# Patient Record
Sex: Male | Born: 1985 | Race: White | Hispanic: No | Marital: Married | Smoking: Former smoker
Health system: Southern US, Community
[De-identification: ages and names within clinical notes are randomized; demographics above are authoritative.]

## PROBLEM LIST (undated history)

## (undated) HISTORY — PX: HERNIA REPAIR: SHX51

---
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
## 2008-10-26 ENCOUNTER — Emergency Department: Payer: Self-pay | Admitting: Emergency Medicine

## 2009-09-19 ENCOUNTER — Emergency Department: Payer: Self-pay | Admitting: Emergency Medicine

## 2011-04-27 ENCOUNTER — Emergency Department (HOSPITAL_COMMUNITY)
Admission: EM | Admit: 2011-04-27 | Discharge: 2011-04-27 | Disposition: A | Discharge status: Home / Self Care | Payer: Self-pay | Attending: Emergency Medicine | Admitting: Emergency Medicine

## 2011-04-27 ENCOUNTER — Encounter: Payer: Self-pay | Admitting: Emergency Medicine

## 2011-04-27 DIAGNOSIS — R231 Pallor: Secondary | ICD-10-CM | POA: Insufficient documentation

## 2011-04-27 DIAGNOSIS — R05 Cough: Secondary | ICD-10-CM | POA: Insufficient documentation

## 2011-04-27 DIAGNOSIS — Z79899 Other long term (current) drug therapy: Secondary | ICD-10-CM | POA: Insufficient documentation

## 2011-04-27 DIAGNOSIS — R059 Cough, unspecified: Secondary | ICD-10-CM | POA: Insufficient documentation

## 2011-04-27 DIAGNOSIS — R110 Nausea: Secondary | ICD-10-CM | POA: Insufficient documentation

## 2011-04-27 DIAGNOSIS — R000 Tachycardia, unspecified: Secondary | ICD-10-CM | POA: Insufficient documentation

## 2011-04-27 DIAGNOSIS — IMO0001 Reserved for inherently not codable concepts without codable children: Secondary | ICD-10-CM | POA: Insufficient documentation

## 2011-04-27 DIAGNOSIS — J111 Influenza due to unidentified influenza virus with other respiratory manifestations: Secondary | ICD-10-CM | POA: Insufficient documentation

## 2011-04-27 DIAGNOSIS — R509 Fever, unspecified: Secondary | ICD-10-CM | POA: Insufficient documentation

## 2011-04-27 DIAGNOSIS — R070 Pain in throat: Secondary | ICD-10-CM | POA: Insufficient documentation

## 2011-04-27 MED ORDER — ONDANSETRON HCL 4 MG/2ML IJ SOLN
4.0000 mg | Freq: Once | INTRAMUSCULAR | Status: AC
  Administered 2011-04-27: 4 mg via INTRAVENOUS
  Filled 2011-04-27: qty 2

## 2011-04-27 MED ORDER — SODIUM CHLORIDE 0.9 % IV BOLUS (SEPSIS)
1000.0000 mL | Freq: Once | INTRAVENOUS | Status: AC
  Administered 2011-04-27: 1000 mL via INTRAVENOUS

## 2011-04-27 MED ORDER — OSELTAMIVIR PHOSPHATE 75 MG PO CAPS: 75.0000 mg | ORAL_CAPSULE | Freq: Two times a day (BID) | ORAL | Status: AC

## 2011-04-27 MED ORDER — KETOROLAC TROMETHAMINE 30 MG/ML IJ SOLN
30.0000 mg | Freq: Once | INTRAMUSCULAR | Status: AC
  Administered 2011-04-27: 30 mg via INTRAVENOUS
  Filled 2011-04-27: qty 1

## 2011-04-27 NOTE — ED Provider Notes (Signed)
History     CSN: [id number]  Arrival date & time 04/27/11  1422   First MD Initiated Contact with Patient 04/27/11 1528      No chief complaint on file.   (Consider location/radiation/quality/duration/timing/severity/associated sxs/prior treatment) HPI Comments: wifr with flu treated with Tamiflu not patient with 24 hours fever, myalgias, HA nausea   The history is provided by the patient.    No past medical history on file.  No past surgical history on file.  No family history on file.  History  Substance Use Topics  . Smoking status: Former Games developer  . Smokeless tobacco: Not on file  . Alcohol Use: No      Review of Systems  Constitutional: Positive for fever and chills.  HENT: Positive for sore throat. Negative for congestion, rhinorrhea and neck stiffness.   Eyes: Negative.   Respiratory: Positive for cough. Negative for shortness of breath and wheezing.   Cardiovascular: Negative.   Gastrointestinal: Positive for nausea.  Genitourinary: Negative for frequency.  Musculoskeletal: Positive for myalgias.  Neurological: Negative for dizziness.  Hematological: Negative.   Psychiatric/Behavioral: Negative.     Allergies  Review of patient's allergies indicates no known allergies.  Home Medications   Current Outpatient Rx  Name Route Sig Dispense Refill  . IBUPROFEN 200 MG PO TABS Oral Take 200 mg by mouth every 6 (six) hours as needed. For pain      . OSELTAMIVIR PHOSPHATE 45 MG PO CAPS Oral Take 45 mg by mouth 1 day or 1 dose.      . OSELTAMIVIR PHOSPHATE 75 MG PO CAPS Oral Take 1 capsule (75 mg total) by mouth every 12 (twelve) hours. 10 capsule 0    BP 128/73  Pulse 105  Temp(Src) 98.7 F (37.1 C) (Oral)  Resp 22  SpO2 100%  Physical Exam  Constitutional: He is oriented to person, place, and time. He appears well-developed.  HENT:  Head: Normocephalic.  Eyes: Pupils are equal, round, and reactive to light.  Neck: Normal range of motion.    Cardiovascular: Tachycardia present.   Neurological: He is oriented to person, place, and time.  Skin: Skin is warm and dry. There is pallor.  Psychiatric: He has a normal mood and affect.    ED Course  Procedures (including critical care time)  Labs Reviewed - No data to display No results found.   1. Influenza     Her will discharge to allow him to travel, with daughter to intensive care unit [facility]  MDM  Most likely flu + contacts within home         Arman Filter, NP 04/27/11 1619  Arman Filter, NP 04/27/11 1710  Arman Filter, NP 04/27/11 1711  Arman Filter, NP 04/27/11 1714

## 2011-04-27 NOTE — ED Notes (Signed)
Pt c/o of cough, fever and body aches for 2 days. States "fever went down last night".

## 2011-04-27 NOTE — ED Notes (Signed)
Father of patient in rm 39 requests to go with daughter, advised to be treated and then at discharge go to [facility]. Requests that his treatment be expedited.

## 2011-04-27 NOTE — ED Notes (Signed)
Patient complains of fever chills, nausea, body aches and headache

## 2011-04-28 NOTE — ED Provider Notes (Signed)
Medical screening examination/treatment/procedure(s) were performed by non-physician practitioner and as supervising physician I was immediately available for consultation/collaboration.  Juliet Rude. Rubin Payor, MD 04/28/11 [phone]

## 2012-05-10 IMAGING — CT CT ABD-PELV W/ CM
2 of 3 series · 15 of 42 positions shown, 19 images · non-contrast
Comparison: none

REASON FOR EXAM: (1) RLQ  pain; (2) RLQ  pain
COMMENTS:

[Series 2: soft tissue · axial · 0.75mm/px · z∈[-1030,-583]mm · 12 of 169 slices shown, 16 images]
[im 13/169  soft-tissue]
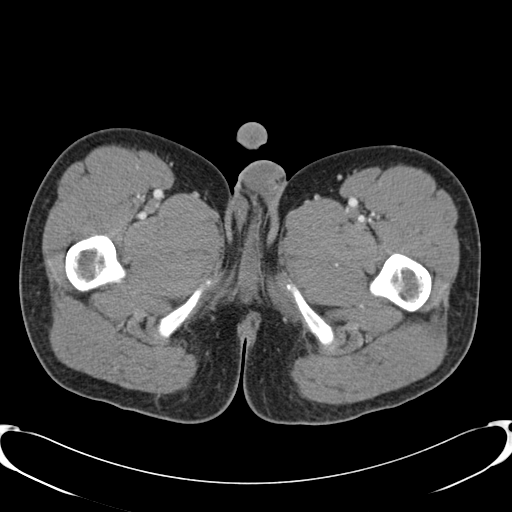
[im 13/169  bone]
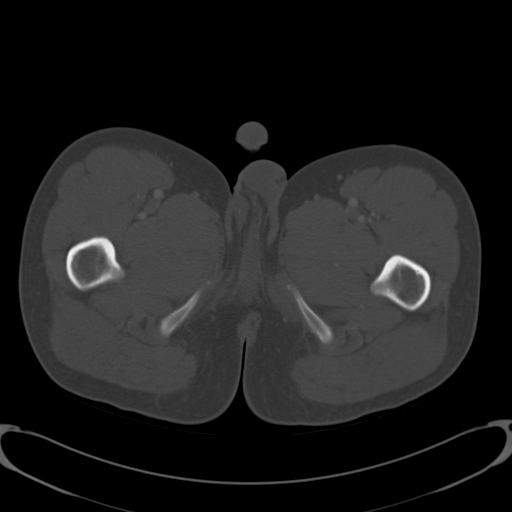
[im 26/169  soft-tissue]
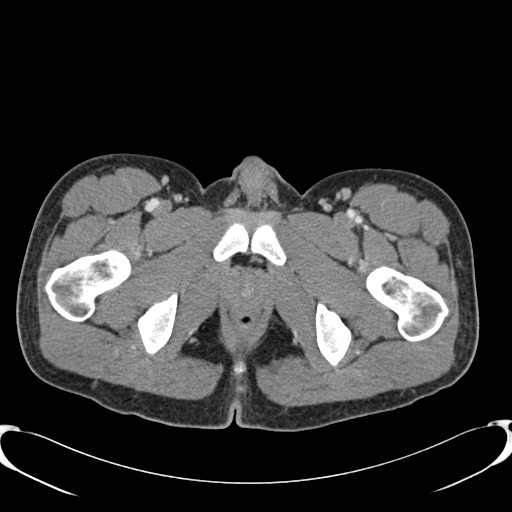
[im 46/169  soft-tissue]
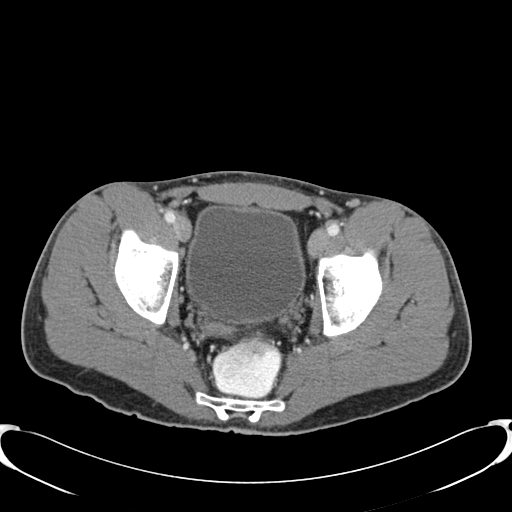
[im 59/169  soft-tissue]
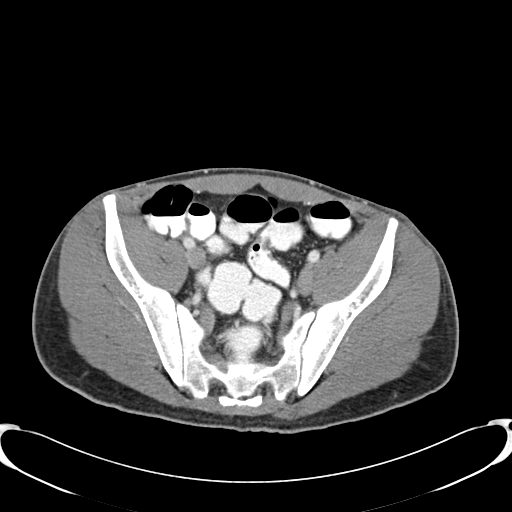
[im 78/169  soft-tissue]
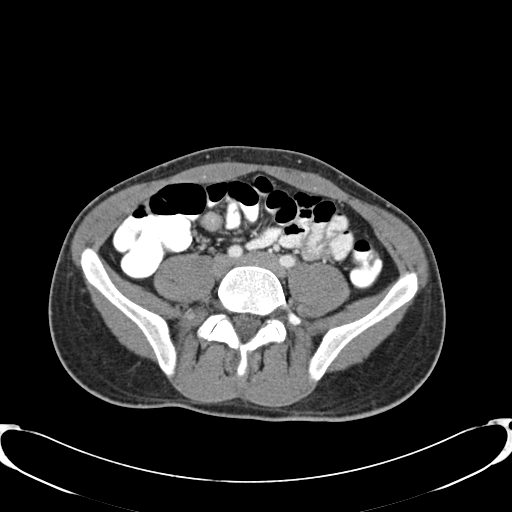
[im 91/169  soft-tissue]
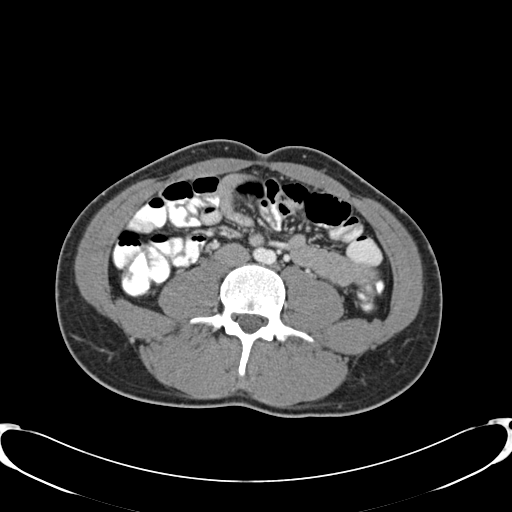
[im 110/169  soft-tissue]
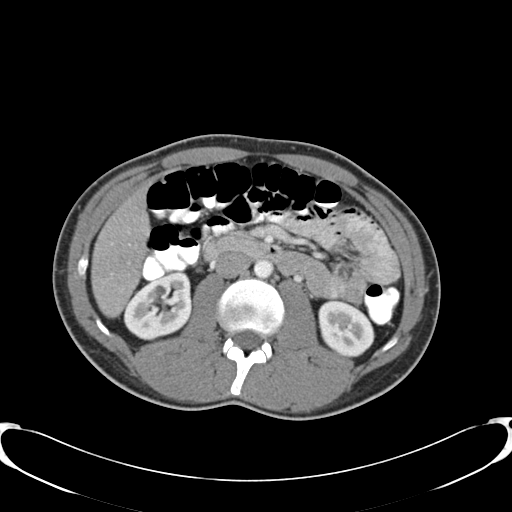
[im 123/169  soft-tissue]
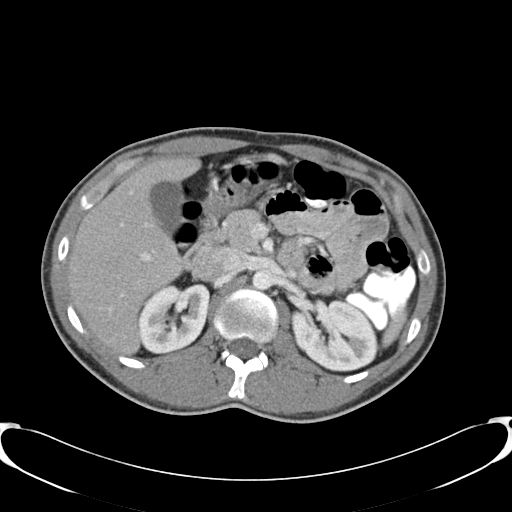
[im 143/169  soft-tissue]
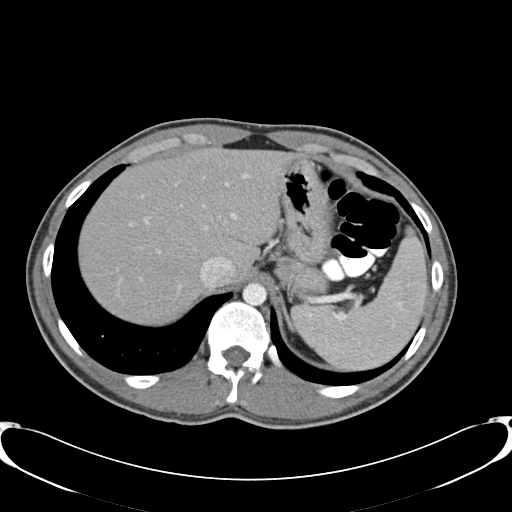
[im 143/169  lung]
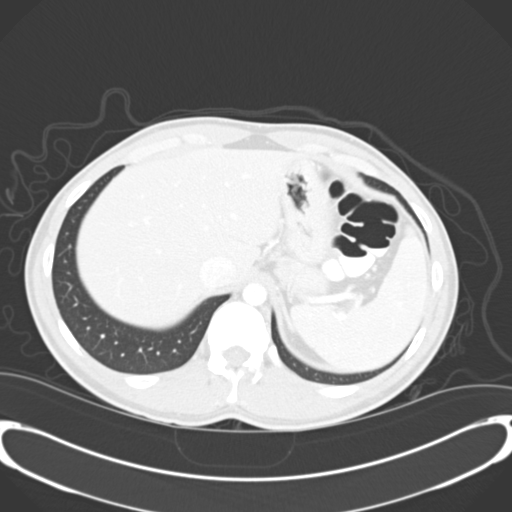
[im 143/169  bone]
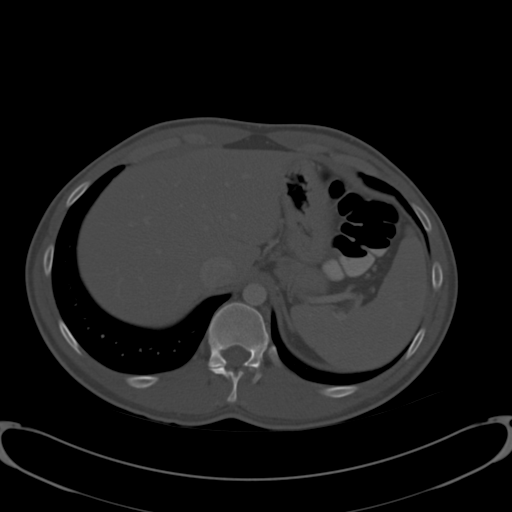
[im 149/169  lung]
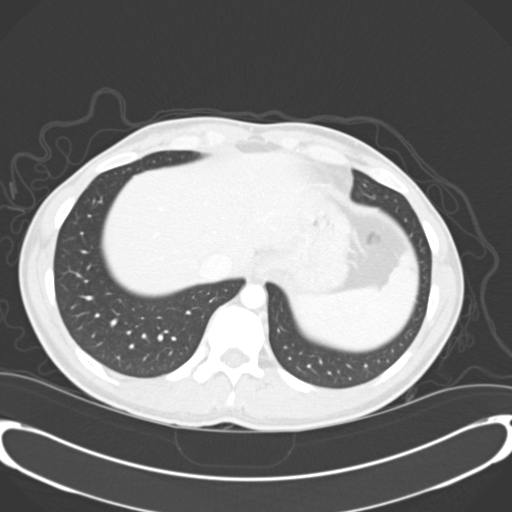
[im 156/169  soft-tissue]
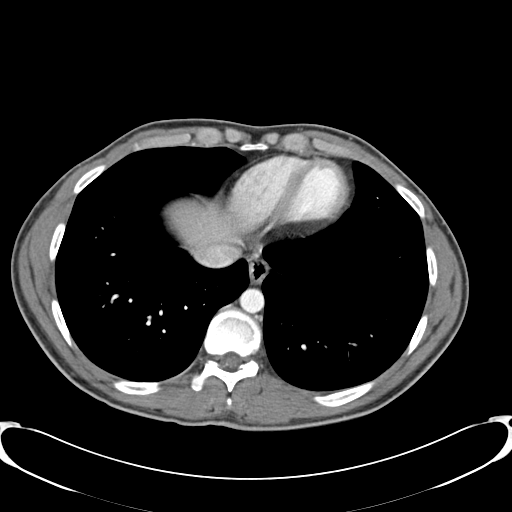
[im 156/169  lung]
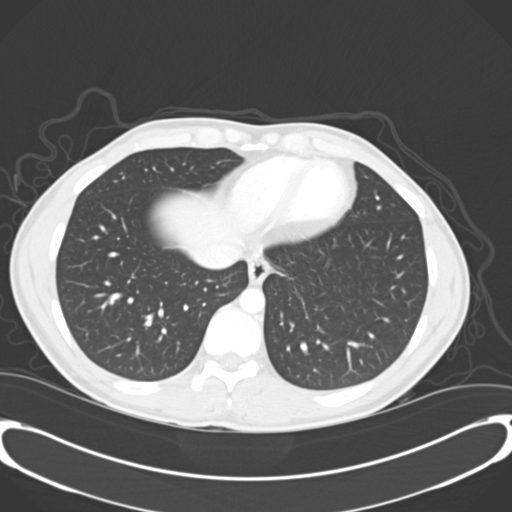
[im 162/169  lung]
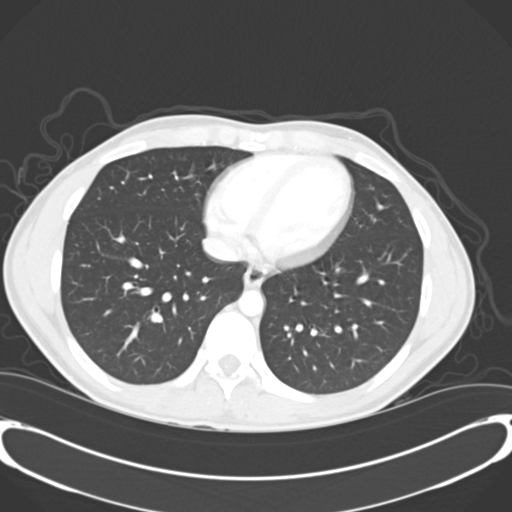

[Series 5: coronal · coronal · 0.99mm/px · 3 of 75 slices shown]
[im 25/75  soft-tissue]
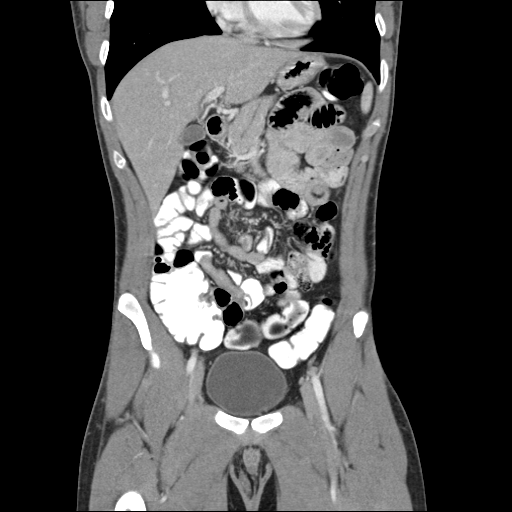
[im 33/75  soft-tissue]
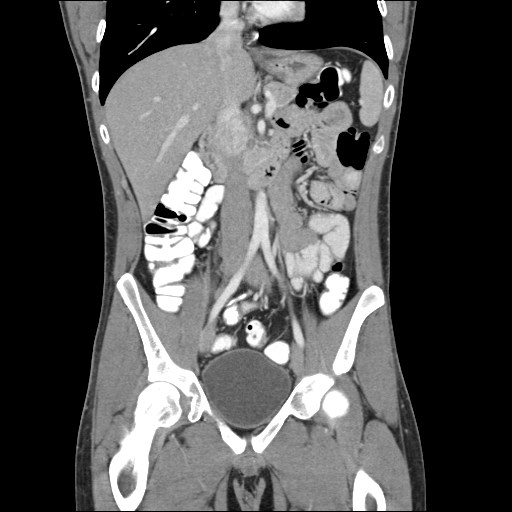
[im 42/75  soft-tissue]
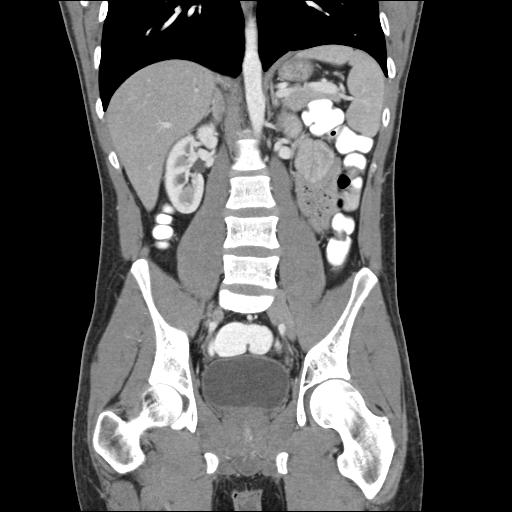

[15 of 42 positions shown; findings below may reference images not displayed]

PROCEDURE:     CT  - CT ABDOMEN / PELVIS  W  - September 19, 2009  [DATE]

RESULT:     Axial CT scanning was performed through the abdomen and pelvis
at 3 mm intervals and slice thicknesses following intravenous administration
of 100 cc of Isovue 370. The patient also received oral contrast material.
Review of multiplanar reconstructed images was performed separately on the
VIA monitor.

In the right lower quadrant of the abdomen on images 98 through 107 a normal
appendix is identified. The orally administered contrast has traversed the
small bowel and reached the colon. There are loops of minimally distended
small bowel in the left mid abdomen. I do not see obstruction of the colon.
There is no evidence of diverticulitis or other acute inflammatory process
of the colon. There is no inguinal nor umbilical hernia.

The liver exhibits no focal mass nor ductal dilation. The gallbladder,
spleen, nondistended stomach, pancreas, adrenal glands, and kidneys exhibit
no acute abnormality. The caliber of the abdominal aorta is normal. The
partially distended urinary bladder is normal in appearance. The prostate
gland and seminal vesicles also appear normal.

The lung bases are clear. The lumbar vertebral bodies are preserved in
height.
IMPRESSION: 1. I do not see evidence of acute appendicitis nor other acute bowel
abnormality. There is mild gaseous distention of loops of small bowel in the
left mid abdomen which is a nonspecific finding which could be related to a
gastroenteritis type process. I do not see enlarged intra-abdominal lymph
nodes.
2. I see no evidence of acute hepatobiliary abnormality nor acute urinary
tract abnormality.

Followup imaging is available if the patient's symptoms persist and remain
unexplained.

## 2022-04-01 ENCOUNTER — Emergency Department (HOSPITAL_BASED_OUTPATIENT_CLINIC_OR_DEPARTMENT_OTHER)
Admission: EM | Admit: 2022-04-01 | Discharge: 2022-04-01 | Disposition: A | Discharge status: Home / Self Care | Payer: No Typology Code available for payment source | Attending: Emergency Medicine | Admitting: Emergency Medicine

## 2022-04-01 ENCOUNTER — Encounter (HOSPITAL_BASED_OUTPATIENT_CLINIC_OR_DEPARTMENT_OTHER): Payer: Self-pay

## 2022-04-01 ENCOUNTER — Other Ambulatory Visit: Payer: Self-pay

## 2022-04-01 ENCOUNTER — Emergency Department (HOSPITAL_BASED_OUTPATIENT_CLINIC_OR_DEPARTMENT_OTHER): Payer: Self-pay

## 2022-04-01 DIAGNOSIS — S9031XA Contusion of right foot, initial encounter: Secondary | ICD-10-CM | POA: Insufficient documentation

## 2022-04-01 DIAGNOSIS — Y9259 Other trade areas as the place of occurrence of the external cause: Secondary | ICD-10-CM | POA: Insufficient documentation

## 2022-04-01 DIAGNOSIS — Y990 Civilian activity done for income or pay: Secondary | ICD-10-CM | POA: Insufficient documentation

## 2022-04-01 DIAGNOSIS — M79671 Pain in right foot: Secondary | ICD-10-CM | POA: Insufficient documentation

## 2022-04-01 DIAGNOSIS — W208XXA Other cause of strike by thrown, projected or falling object, initial encounter: Secondary | ICD-10-CM | POA: Insufficient documentation

## 2022-04-01 DIAGNOSIS — S99921A Unspecified injury of right foot, initial encounter: Secondary | ICD-10-CM | POA: Diagnosis present

## 2022-04-01 MED ORDER — IBUPROFEN 400 MG PO TABS
600.0000 mg | ORAL_TABLET | Freq: Once | ORAL | Status: AC
  Administered 2022-04-01: 600 mg via ORAL
  Filled 2022-04-01: qty 1

## 2022-04-01 NOTE — ED Triage Notes (Addendum)
Pt was at work at [employer] and dropped a 180lb box on top of RT foot. Pt finished rest of shift and was told by Safety department to come to ED. Pt unable to bear weight on RT foot. Unable to palpate pedal pulse; pt able to feel sensation, no numbness. Able to wiggle toes. Pt received 800mg  ibuprofen by staff at work.

## 2022-04-01 NOTE — ED Provider Notes (Signed)
   [facility] EMERGENCY DEPARTMENT  Provider Note  CSN: [id number] Arrival date & time: 04/01/22 0000  History Chief Complaint  Patient presents with   Foot Injury    Steve Schultz is a 36 y.o. male reports he was at work in the [employer] earlier in the day when he accidentally dropped a heavy box on his R foot. He continued working but at the end of his shift he sat down for a few minutes to rest before starting a second shift and his foot began to hurt more and he was unable to stand or bear weight.    Home Medications Prior to Admission medications   Medication Sig Start Date End Date Taking? Authorizing Provider  ibuprofen (ADVIL,MOTRIN) 200 MG tablet Take 200 mg by mouth every 6 (six) hours as needed. For pain      [provider]  oseltamivir (TAMIFLU) 45 MG capsule Take 45 mg by mouth 1 day or 1 dose.      [provider]     Allergies    Tramadol   Review of Systems   Review of Systems Please see HPI for pertinent positives and negatives  Physical Exam BP 134/80   Pulse 82   Temp 98.2 F (36.8 C) (Oral)   Resp 18   Ht 6' (1.829 m)   Wt 95.3 kg   SpO2 100%   BMI 28.48 kg/m   Physical Exam Vitals and nursing note reviewed.  HENT:     Head: Normocephalic.     Nose: Nose normal.  Eyes:     Extraocular Movements: Extraocular movements intact.  Cardiovascular:     Pulses: Normal pulses.  Pulmonary:     Effort: Pulmonary effort is normal.  Musculoskeletal:        General: Swelling and tenderness (dorsal R foot, some bruising there) present. No deformity. Normal range of motion.     Cervical back: Neck supple.  Skin:    Findings: No rash (on exposed skin).  Neurological:     Mental Status: He is alert and oriented to person, place, and time.  Psychiatric:        Mood and Affect: Mood normal.     ED Results / Procedures / Treatments   EKG None  Procedures Procedures  Medications Ordered in the ED Medications   ibuprofen (ADVIL) tablet 600 mg (has no administration in time range)    Initial Impression and Plan  Patient here with R foot injury. I personally viewed the images from radiology studies and agree with radiologist interpretation: Xray is neg. Plan Post-op shoe and crutches. Weight bearing as tolerated. NSAIDs for pain. Recommend rest, ice and elevation. Follow up with [facility] per [employer] policy.   ED Course       MDM Rules/Calculators/A&P Medical Decision Making Problems Addressed: Contusion of right foot, initial encounter: acute illness or injury  Amount and/or Complexity of Data Reviewed Radiology: ordered and independent interpretation performed. Decision-making details documented in ED Course.  Risk OTC drugs.    Final Clinical Impression(s) / ED Diagnoses Final diagnoses:  Contusion of right foot, initial encounter    Rx / DC Orders ED Discharge Orders     None        Pollyann Savoy, MD 04/01/22 [phone]

## 2024-06-22 ENCOUNTER — Ambulatory Visit: Payer: Self-pay | Admitting: Physician Assistant
